# Patient Record
Sex: Male | Born: 1980 | Race: White | Hispanic: No | Marital: Married | Smoking: Current some day smoker
Health system: Southern US, Community
[De-identification: ages and names within clinical notes are randomized; demographics above are authoritative.]

## PROBLEM LIST (undated history)

## (undated) DIAGNOSIS — R569 Unspecified convulsions: Secondary | ICD-10-CM

---

## 2006-12-05 ENCOUNTER — Ambulatory Visit: Payer: Self-pay | Admitting: Otolaryngology

## 2006-12-14 ENCOUNTER — Ambulatory Visit: Payer: Self-pay | Admitting: Otolaryngology

## 2007-04-12 ENCOUNTER — Emergency Department: Payer: Self-pay | Admitting: Emergency Medicine

## 2014-06-13 ENCOUNTER — Emergency Department: Payer: Self-pay | Admitting: Emergency Medicine

## 2014-06-23 ENCOUNTER — Emergency Department: Payer: Self-pay | Admitting: Emergency Medicine

## 2014-06-23 LAB — BASIC METABOLIC PANEL
ANION GAP: 4 — AB (ref 7–16)
BUN: 6 mg/dL — ABNORMAL LOW (ref 7–18)
CHLORIDE: 108 mmol/L — AB (ref 98–107)
CREATININE: 0.94 mg/dL (ref 0.60–1.30)
Calcium, Total: 8.6 mg/dL (ref 8.5–10.1)
Co2: 29 mmol/L (ref 21–32)
EGFR (African American): >60
GLUCOSE: 102 mg/dL — AB (ref 65–99)
Osmolality: 279 (ref 275–301)
Potassium: 4.1 mmol/L (ref 3.5–5.1)
Sodium: 141 mmol/L (ref 136–145)

## 2014-12-30 ENCOUNTER — Emergency Department
Admit: 2014-12-30 | Disposition: A | Discharge status: Home / Self Care | Payer: Self-pay | Admitting: Emergency Medicine

## 2014-12-30 LAB — BASIC METABOLIC PANEL
Anion Gap: 8 (ref 7–16)
BUN: 12 mg/dL
CHLORIDE: 102 mmol/L
CO2: 23 mmol/L
Calcium, Total: 8.9 mg/dL
Creatinine: 0.97 mg/dL
EGFR (African American): >60
EGFR (Non-African Amer.): >60
GLUCOSE: 129 mg/dL — AB
Potassium: 3.4 mmol/L — ABNORMAL LOW
SODIUM: 133 mmol/L — AB

## 2014-12-30 LAB — CBC WITH DIFFERENTIAL/PLATELET
BASOS PCT: 0.2 %
Basophil #: 0 10*3/uL (ref 0.0–0.1)
Eosinophil #: 0 10*3/uL (ref 0.0–0.7)
Eosinophil %: 0 %
HCT: 40.3 % (ref 40.0–52.0)
HGB: 13.8 g/dL (ref 13.0–18.0)
LYMPHS PCT: 6.6 %
Lymphocyte #: 1 10*3/uL (ref 1.0–3.6)
MCH: 29.7 pg (ref 26.0–34.0)
MCHC: 34.3 g/dL (ref 32.0–36.0)
MCV: 87 fL (ref 80–100)
Monocyte #: 1.4 x10 3/mm — ABNORMAL HIGH (ref 0.2–1.0)
Monocyte %: 9 %
NEUTROS PCT: 84.2 %
Neutrophil #: 12.9 10*3/uL — ABNORMAL HIGH (ref 1.4–6.5)
PLATELETS: 193 10*3/uL (ref 150–440)
RBC: 4.65 10*6/uL (ref 4.40–5.90)
RDW: 13.2 % (ref 11.5–14.5)
WBC: 15.3 10*3/uL — ABNORMAL HIGH (ref 3.8–10.6)

## 2014-12-30 LAB — ED INFLUENZA
INFLBPCR: NEGATIVE
Influenza A By PCR: NEGATIVE

## 2019-04-25 ENCOUNTER — Emergency Department
Admission: EM | Admit: 2019-04-25 | Discharge: 2019-04-25 | Disposition: A | Discharge status: Home / Self Care | Payer: Self-pay | Attending: Emergency Medicine | Admitting: Emergency Medicine

## 2019-04-25 ENCOUNTER — Emergency Department: Payer: Self-pay

## 2019-04-25 ENCOUNTER — Other Ambulatory Visit: Payer: Self-pay

## 2019-04-25 DIAGNOSIS — Z79899 Other long term (current) drug therapy: Secondary | ICD-10-CM | POA: Insufficient documentation

## 2019-04-25 DIAGNOSIS — F17220 Nicotine dependence, chewing tobacco, uncomplicated: Secondary | ICD-10-CM | POA: Insufficient documentation

## 2019-04-25 DIAGNOSIS — N23 Unspecified renal colic: Secondary | ICD-10-CM | POA: Insufficient documentation

## 2019-04-25 DIAGNOSIS — F17200 Nicotine dependence, unspecified, uncomplicated: Secondary | ICD-10-CM | POA: Insufficient documentation

## 2019-04-25 DIAGNOSIS — N50811 Right testicular pain: Secondary | ICD-10-CM | POA: Insufficient documentation

## 2019-04-25 HISTORY — DX: Unspecified convulsions: R56.9

## 2019-04-25 LAB — CBC WITH DIFFERENTIAL/PLATELET
Abs Immature Granulocytes: 0.03 10*3/uL (ref 0.00–0.07)
Basophils Absolute: 0.1 10*3/uL (ref 0.0–0.1)
Basophils Relative: 1 %
Eosinophils Absolute: 0.5 10*3/uL (ref 0.0–0.5)
Eosinophils Relative: 5 %
HCT: 42.3 % (ref 39.0–52.0)
Hemoglobin: 14.7 g/dL (ref 13.0–17.0)
Immature Granulocytes: 0 %
Lymphocytes Relative: 29 %
Lymphs Abs: 2.8 10*3/uL (ref 0.7–4.0)
MCH: 29.7 pg (ref 26.0–34.0)
MCHC: 34.8 g/dL (ref 30.0–36.0)
MCV: 85.5 fL (ref 80.0–100.0)
Monocytes Absolute: 0.5 10*3/uL (ref 0.1–1.0)
Monocytes Relative: 5 %
Neutro Abs: 5.7 10*3/uL (ref 1.7–7.7)
Neutrophils Relative %: 60 %
Platelets: 323 10*3/uL (ref 150–400)
RBC: 4.95 MIL/uL (ref 4.22–5.81)
RDW: 13.1 % (ref 11.5–15.5)
WBC: 9.7 10*3/uL (ref 4.0–10.5)
nRBC: 0 % (ref 0.0–0.2)

## 2019-04-25 LAB — URINALYSIS, COMPLETE (UACMP) WITH MICROSCOPIC
Bilirubin Urine: NEGATIVE
Glucose, UA: NEGATIVE mg/dL
Ketones, ur: 20 mg/dL — AB
Leukocytes,Ua: NEGATIVE
Nitrite: NEGATIVE
Protein, ur: 30 mg/dL — AB
RBC / HPF: >50 RBC/hpf — ABNORMAL HIGH (ref 0–5)
Specific Gravity, Urine: 1.019 (ref 1.005–1.030)
pH: 6 (ref 5.0–8.0)

## 2019-04-25 LAB — COMPREHENSIVE METABOLIC PANEL
ALT: 24 U/L (ref 0–44)
AST: 23 U/L (ref 15–41)
Albumin: 4.3 g/dL (ref 3.5–5.0)
Alkaline Phosphatase: 77 U/L (ref 38–126)
Anion gap: 12 (ref 5–15)
BUN: 10 mg/dL (ref 6–20)
CO2: 17 mmol/L — ABNORMAL LOW (ref 22–32)
Calcium: 9.3 mg/dL (ref 8.9–10.3)
Chloride: 105 mmol/L (ref 98–111)
Creatinine, Ser: 1.08 mg/dL (ref 0.61–1.24)
GFR calc Af Amer: >60 mL/min (ref >60)
GFR calc non Af Amer: >60 mL/min (ref >60)
Glucose, Bld: 158 mg/dL — ABNORMAL HIGH (ref 70–99)
Potassium: 3.7 mmol/L (ref 3.5–5.1)
Sodium: 134 mmol/L — ABNORMAL LOW (ref 135–145)
Total Bilirubin: 0.6 mg/dL (ref 0.3–1.2)
Total Protein: 7.7 g/dL (ref 6.5–8.1)

## 2019-04-25 MED ORDER — HYDROMORPHONE HCL 1 MG/ML IJ SOLN
1.0000 mg | Freq: Once | INTRAMUSCULAR | Status: AC
  Administered 2019-04-25: 1 mg via INTRAVENOUS
  Filled 2019-04-25: qty 1

## 2019-04-25 MED ORDER — KETOROLAC TROMETHAMINE 30 MG/ML IJ SOLN
30.0000 mg | Freq: Once | INTRAMUSCULAR | Status: AC
  Administered 2019-04-25: 30 mg via INTRAVENOUS
  Filled 2019-04-25: qty 1

## 2019-04-25 MED ORDER — HYDROMORPHONE HCL 1 MG/ML IJ SOLN
INTRAMUSCULAR | Status: AC
  Administered 2019-04-25: 1 mg via INTRAVENOUS
  Filled 2019-04-25: qty 1

## 2019-04-25 MED ORDER — HYDROMORPHONE HCL 1 MG/ML IJ SOLN
1.0000 mg | Freq: Once | INTRAMUSCULAR | Status: AC
  Administered 2019-04-25 (×2): 1 mg via INTRAVENOUS

## 2019-04-25 MED ORDER — OXYCODONE-ACETAMINOPHEN 5-325 MG PO TABS: 1.0000 | ORAL_TABLET | ORAL | 0 refills | Status: AC | PRN

## 2019-04-25 MED ORDER — HYDROMORPHONE HCL 1 MG/ML IJ SOLN
1.0000 mg | Freq: Once | INTRAMUSCULAR | Status: AC
  Administered 2019-04-25: 1 mg via INTRAVENOUS

## 2019-04-25 MED ORDER — ONDANSETRON HCL 4 MG/2ML IJ SOLN
4.0000 mg | Freq: Once | INTRAMUSCULAR | Status: AC
  Administered 2019-04-25: 4 mg via INTRAVENOUS
  Filled 2019-04-25: qty 2

## 2019-04-25 NOTE — ED Notes (Signed)
Reports pain mostly in his back on right side, radiates into his groin area, reports pain meds given in route to ed barely took the pain aware, denies nausea at present time.

## 2019-04-25 NOTE — ED Notes (Signed)
Patient feeling better, reports pain level down significantly, sitting up in bed smiling and laughing with wife. Urine specimen requested. Will attempt a sample. Will continue to monitor.

## 2019-04-25 NOTE — ED Triage Notes (Signed)
Patient from home "Reports sudden onset of right flank pain that began @ 0900 this morning with pain radiating into right testicle" . Also c/o of nausea and vomiting that came with the onset of pain. Patient given total 150mcg IV fentanyl and 4 zofran in route prior to ed arrival.

## 2019-04-25 NOTE — ED Notes (Signed)
Awaiting disposition.

## 2019-04-25 NOTE — ED Notes (Signed)
Second dose of 1 mg diluadid given as per Dr. Rip Harbour. Ct called to come get patient for study. Wife at bedside.

## 2019-04-25 NOTE — ED Notes (Signed)
Patient unable to go to CT due to pain. Md made aware.

## 2019-04-25 NOTE — ED Notes (Signed)
urione and urine culture obtained and sent

## 2019-04-25 NOTE — Discharge Instructions (Addendum)
Please drink plenty of fluids.  Please follow-up with urology. Dr. Diamantina Providence is on call.  Give the office a call today or tomorrow let them know you had a kidney stone.  You should get a follow-up with him in the next week or so.  Please return here for worse pain fever or vomiting.  If you have further pain take the Percocet 1 pill 4 times a day as needed.  If you do not have any further pain do not take the Percocet.

## 2019-04-25 NOTE — ED Provider Notes (Addendum)
[facility] Emergency Department Provider Note   ____________________________________________   First MD Initiated Contact with Patient 04/25/19 1213     (approximate)  I have reviewed the triage vital signs and the nursing notes.   HISTORY  Chief Complaint Testicle Pain and Flank Pain    HPI Cole Wood is a 38 y.o. male reports testicular pain starting this morning.  Pain started in the right flank and went around toward the testicle and into the testicle with the testicles also tender to touch.  Its scrotum was contracted up against the abdomen.  There is also a right lower quadrant tenderness.  Pain is severe achy patient is not had a need to urinate.  Nothing makes it better or worse except for palpation which makes it worse.  He is not running a fever.  He is not having any vomiting or diarrhea.        Past Medical History:  Diagnosis Date   Seizures (HCC)    reports no longer have them. sz due to migraines.     There are no active problems to display for this patient.   History reviewed. No pertinent surgical history.  Prior to Admission medications   Medication Sig Start Date End Date Taking? Authorizing Provider  esomeprazole (NEXIUM) 20 MG capsule Take 20 mg by mouth daily at 12 noon.   Yes [provider]  venlafaxine (EFFEXOR) 75 MG tablet Take 75 mg by mouth 2 (two) times daily. 11/16/18  Yes [provider]  oxyCODONE-acetaminophen (PERCOCET) 5-325 MG tablet Take 1 tablet by mouth every 4 (four) hours as needed for severe pain. 04/25/19 04/24/20  Arnaldo NatalMalinda, Dorrene Bently F, MD    Allergies Patient has no known allergies.  History reviewed. No pertinent family history.  Social History Social History   Tobacco Use   Smoking status: Current Some Day Smoker   Smokeless tobacco: Current User  Substance Use Topics   Alcohol use: Not on file   Drug use: Not on file    Review of Systems  Constitutional: No  fever/chills Eyes: No visual changes. ENT: No sore throat. Cardiovascular: Denies chest pain. Respiratory: Denies shortness of breath. Gastrointestinal: See HPI Genitourinary: Negative for dysuria. Musculoskeletal: Right CVA area back pain. Skin: Negative for rash. Neurological: Negative for headaches, focal weakness  ____________________________________________   PHYSICAL EXAM:  VITAL SIGNS: ED Triage Vitals  Enc Vitals Group     BP 04/25/19 1207 (!) 157/138     Pulse Rate 04/25/19 1207 63     Resp 04/25/19 1207 (!) 36     Temp 04/25/19 1207 97.6 F (36.4 C)     Temp Source 04/25/19 1207 Oral     SpO2 04/25/19 1203 97 %     Weight 04/25/19 1209 240 lb (108.9 kg)     Height 04/25/19 1209 6\' 2"  (1.88 m)     Head Circumference --      Peak Flow --      Pain Score 04/25/19 1208 10     Pain Loc --      Pain Edu? --      Excl. in GC? --     Constitutional: Alert and oriented.  In severe pain Eyes: Conjunctivae are normal.  Head: Atraumatic. Nose: No congestion/rhinnorhea. Mouth/Throat: Mucous membranes are moist.  Oropharynx non-erythematous. Neck: No stridor. Cardiovascular: Normal rate, regular rhythm. Grossly normal heart sounds.  Good peripheral circulation. Respiratory: Normal respiratory effort.  No retractions. Lungs CTAB. Gastrointestinal: Soft right lower quadrant tenderness.  No distention. No abdominal bruits.  Right CVA tenderness. Musculoskeletal: No lower extremity tenderness nor edema.  Neurologic:  Normal speech and language. No gross focal neurologic deficits are appreciated. No gait instability. Skin:  Skin is warm, dry and intact. No rash noted. GU: Right testicle is not enlarged does not appear to have a unusual lie but it is contracted epigastric trunk.  Is also tender. ____________________________________________   LABS (all labs ordered are listed, but only abnormal results are displayed)  Labs Reviewed  URINALYSIS, COMPLETE (UACMP) WITH  MICROSCOPIC - Abnormal; Notable for the following components:      Result Value   Color, Urine YELLOW (*)    APPearance HAZY (*)    Hgb urine dipstick LARGE (*)    Ketones, ur 20 (*)    Protein, ur 30 (*)    RBC / HPF >50 (*)    Bacteria, UA RARE (*)    All other components within normal limits  COMPREHENSIVE METABOLIC PANEL - Abnormal; Notable for the following components:   Sodium 134 (*)    CO2 17 (*)    Glucose, Bld 158 (*)    All other components within normal limits  CBC WITH DIFFERENTIAL/PLATELET  URINALYSIS, COMPLETE (UACMP) WITH MICROSCOPIC   ____________________________________________  EKG   ____________________________________________  RADIOLOGY  ED MD interpretation:   Official radiology report(s): Ct Renal Stone Study  Result Date: 04/25/2019 CLINICAL DATA:  Right flank pain with nausea and vomiting EXAM: CT ABDOMEN AND PELVIS WITHOUT CONTRAST TECHNIQUE: Multidetector CT imaging of the abdomen and pelvis was performed following the standard protocol without oral or IV contrast. COMPARISON:  None. FINDINGS: Lower chest: Lung bases are clear. Hepatobiliary: There is hepatic steatosis. No liver lesions are apparent on this noncontrast enhanced study. Gallbladder wall is not appreciably thickened. There is no biliary duct dilatation. Pancreas: There is no pancreatic mass or inflammatory focus. Spleen: No splenic lesions are evident. Adrenals/Urinary Tract: Adrenals bilaterally appear normal. Kidneys bilaterally show no evident mass. There is slight hydronephrosis on the right. There is no hydronephrosis on the left. There is no intrarenal calculus on either side. There is a 2 mm calculus at the right ureterovesical junction. There is mild edema tracking along the right ureter throughout most of its course. No other ureteral calculi are evident on either side. Urinary bladder is midline with wall thickness within normal limits. Stomach/Bowel: There is no appreciable bowel  wall or mesenteric thickening. No evident bowel obstruction. Terminal ileum appears normal. No free air or portal venous air. Vascular/Lymphatic: There is no abdominal aortic aneurysm. No vascular lesions are evident on this noncontrast enhanced study. There is no adenopathy in the abdomen or pelvis. Reproductive: There are a few small prostatic calculi. Prostate and seminal vesicles are normal in size and contour. No evident pelvic mass. Other: The appendix appears normal. There is no abscess or ascites in the abdomen or pelvis. There is a rather minimal ventral hernia containing only fat. Musculoskeletal: There are no blastic or lytic bone lesions. There is no intramuscular or abdominal wall lesion. IMPRESSION: 1. 2 mm calculus at the right ureterovesical junction with mild hydronephrosis on the right. 2. No bowel obstruction. No abscess in the abdomen or pelvis. Appendix appears normal. 3.  Hepatic steatosis. 4.  Rather minimal ventral hernia containing only fat. Electronically Signed   By: Bretta BangWilliam  Woodruff III M.D.   On: 04/25/2019 13:32   [place] Scrotum W/doppler  Result Date: 04/25/2019 CLINICAL DATA:  Right-sided testicle pain EXAM: SCROTAL ULTRASOUND  DOPPLER ULTRASOUND OF THE TESTICLES TECHNIQUE: Complete ultrasound examination of the testicles, epididymis, and other scrotal structures was performed. Color and spectral Doppler ultrasound were also utilized to evaluate blood flow to the testicles. COMPARISON:  None. FINDINGS: Right testicle Measurements: 5 x 2.6 x 3.1 cm. No mass or microlithiasis visualized. Left testicle Measurements: 5.1 x 2.4 x 3.1 cm. No mass or microlithiasis visualized. Right epididymis:  Normal in size and appearance. Left epididymis:  Normal in size and appearance. Hydrocele:  None visualized. Varicocele:  None visualized. Pulsed Doppler interrogation of both testes demonstrates normal low resistance arterial and venous waveforms bilaterally. IMPRESSION: Negative scrotal ultrasound  Electronically Signed   By: Donavan Foil M.D.   On: 04/25/2019 14:00    ____________________________________________   PROCEDURES  Procedure(s) performed (including Critical Care):  Procedures   ____________________________________________   INITIAL IMPRESSION / ASSESSMENT AND PLAN / ED COURSE  This may be a renal stone in fact probably is a renal stone but because of the testicle pain and the fact the scrotum is contracted up against the abdomen on the right side only we will get an ultrasound of the testicle before we do anything else.       CT shows a stone almost very to go into the bladder ultrasound is normal we will discharge the patient.  On discharge she is much better not having any further pain.  Patient will strain all his urine.  Nurse will get him a strainer and explained how to do it.  He will take the stone to the urologist if he can catch it.       ____________________________________________   FINAL CLINICAL IMPRESSION(S) / ED DIAGNOSES  Final diagnoses:  Ureteral colic     ED Discharge Orders         Ordered    oxyCODONE-acetaminophen (PERCOCET) 5-325 MG tablet  Every 4 hours PRN     04/25/19 1653           Note:  This document was prepared using Dragon voice recognition software and may include unintentional dictation errors.    Nena Polio, MD 04/25/19 1659    Nena Polio, MD 04/25/19 [phone]

## 2020-06-14 IMAGING — US ULTRASOUND SCROTUM DOPPLER COMPLETE
1 series · 14 of 25 positions shown · non-contrast
Comparison: None.

CLINICAL DATA: Right-sided testicle pain

EXAM:
SCROTAL ULTRASOUND
DOPPLER ULTRASOUND OF THE TESTICLES
TECHNIQUE: Complete ultrasound examination of the testicles, epididymis, and
other scrotal structures was performed. Color and spectral Doppler
ultrasound were also utilized to evaluate blood flow to the
testicles.

[Series 1: ultrasound scrotum doppler complete · 14 of 51 slices shown]
[im 1/51]
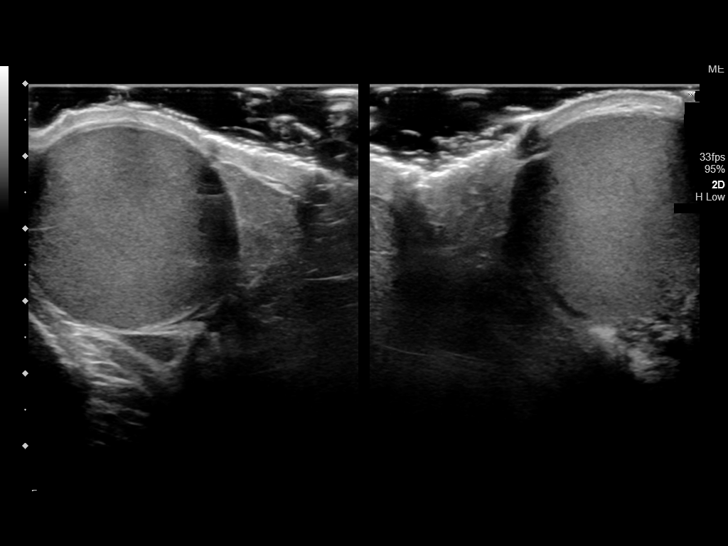
[im 5/51]
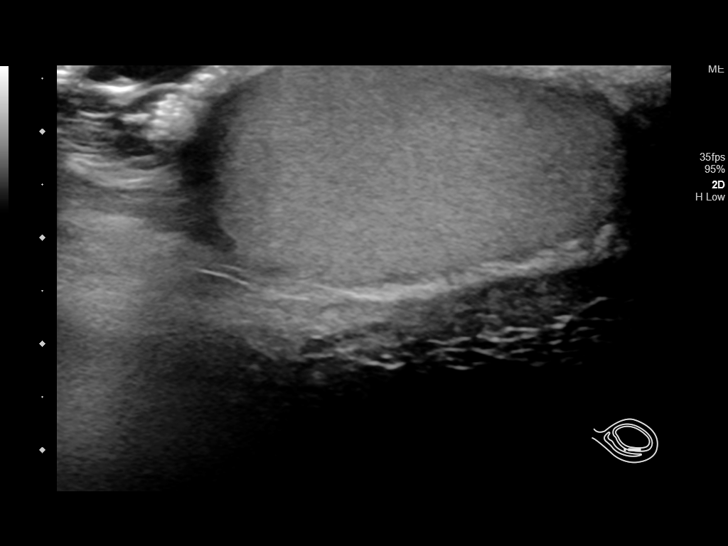
[im 9/51]
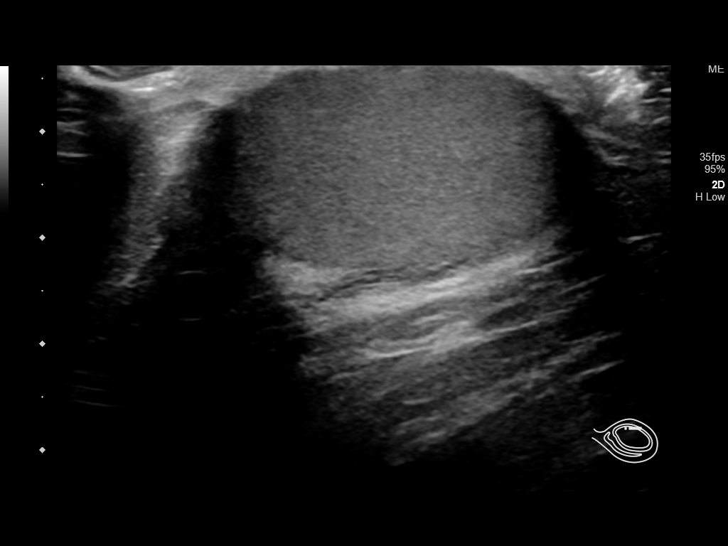
[im 13/51]
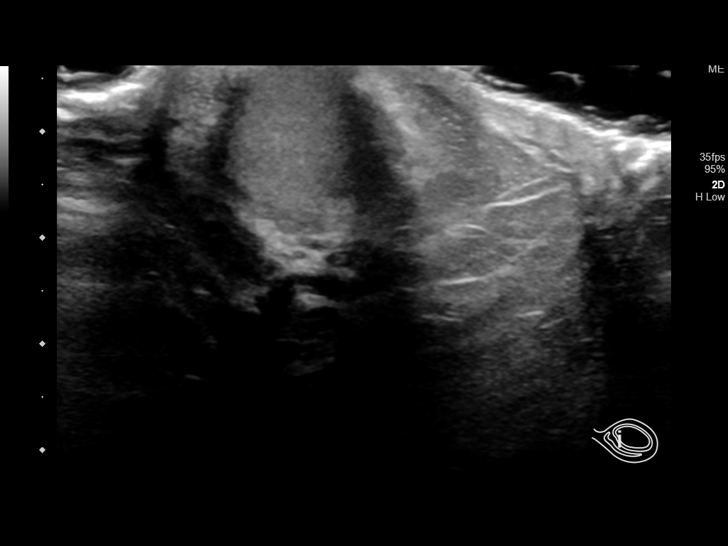
[im 17/51]
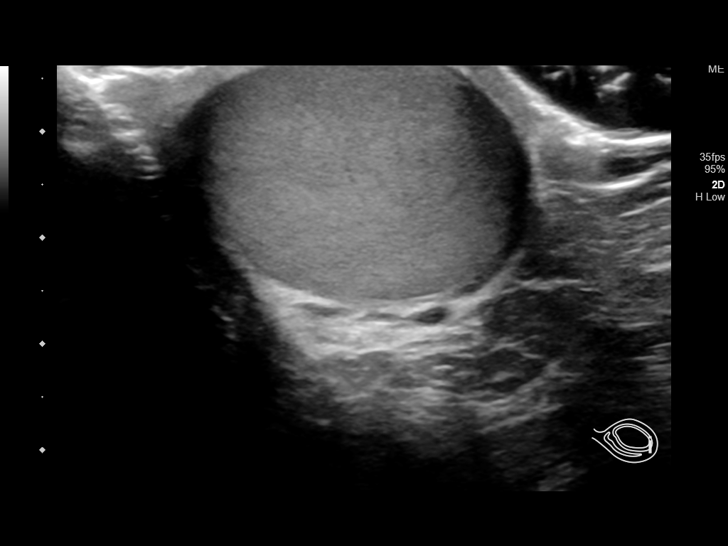
[im 19/51]
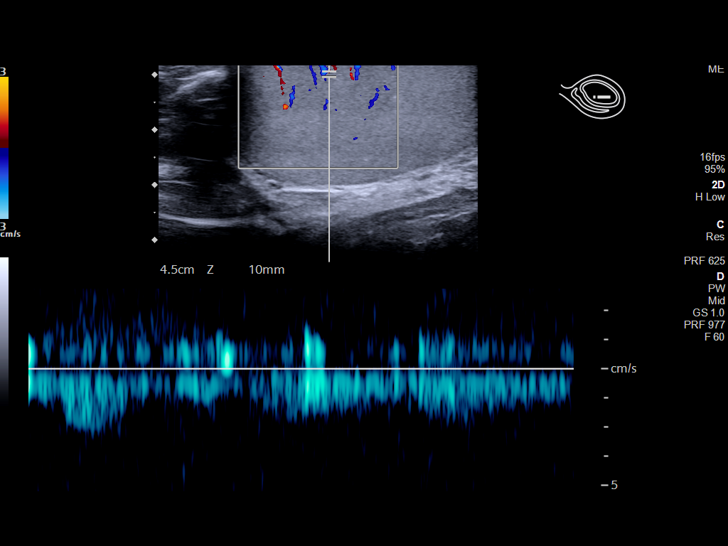
[im 23/51]
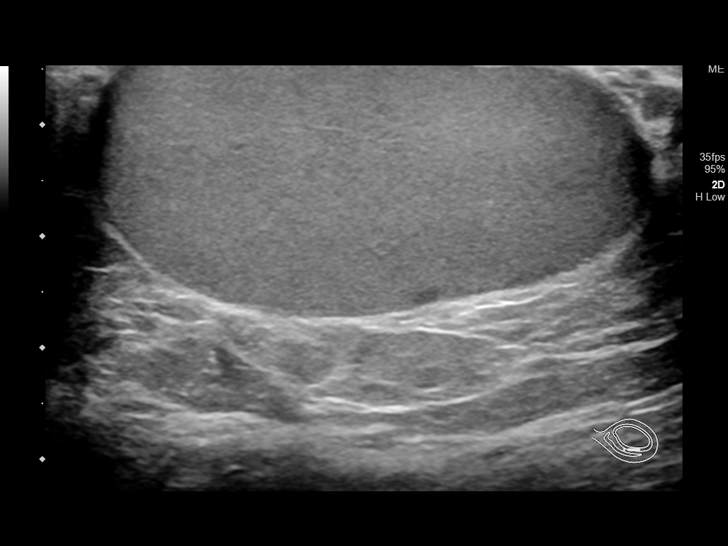
[im 28/51]
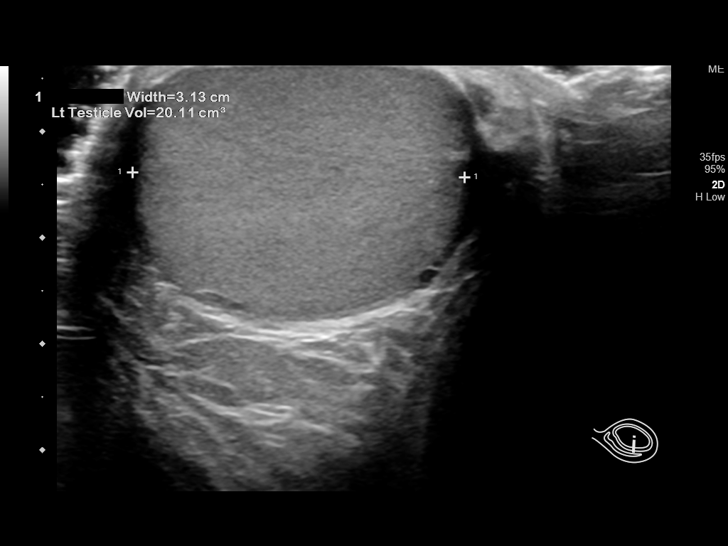
[im 32/51]
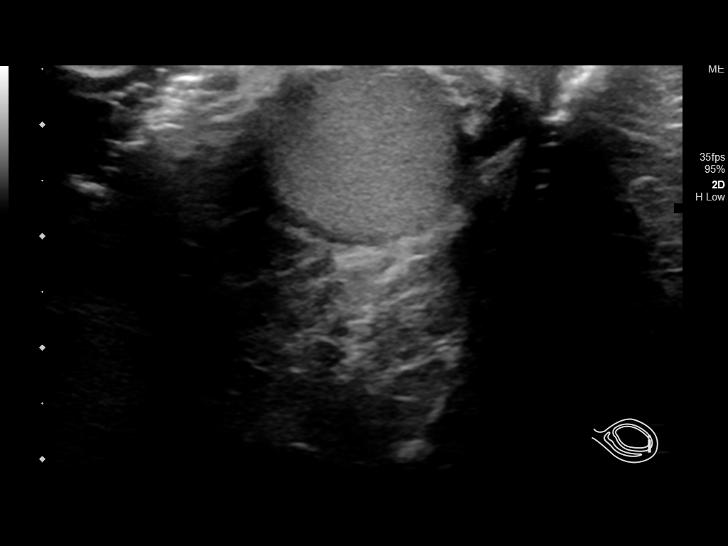
[im 34/51]
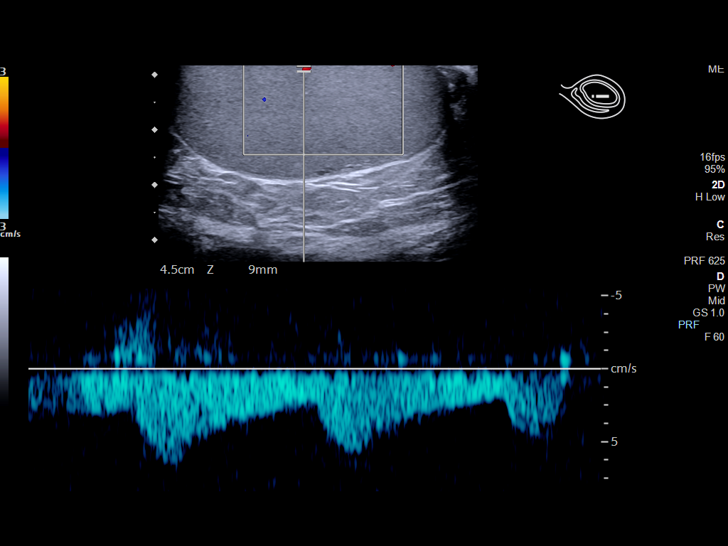
[im 38/51]
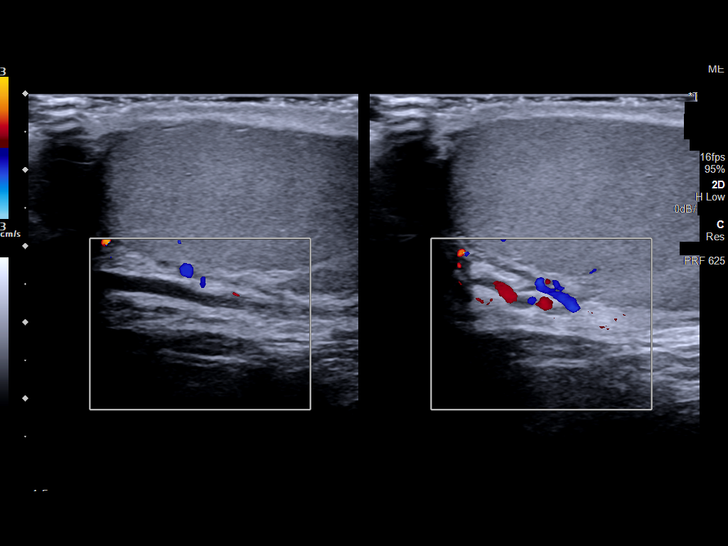
[im 42/51]
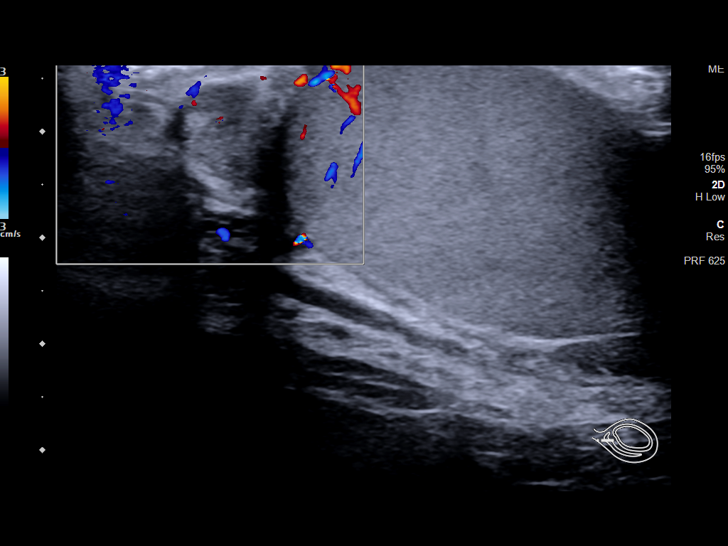
[im 46/51]
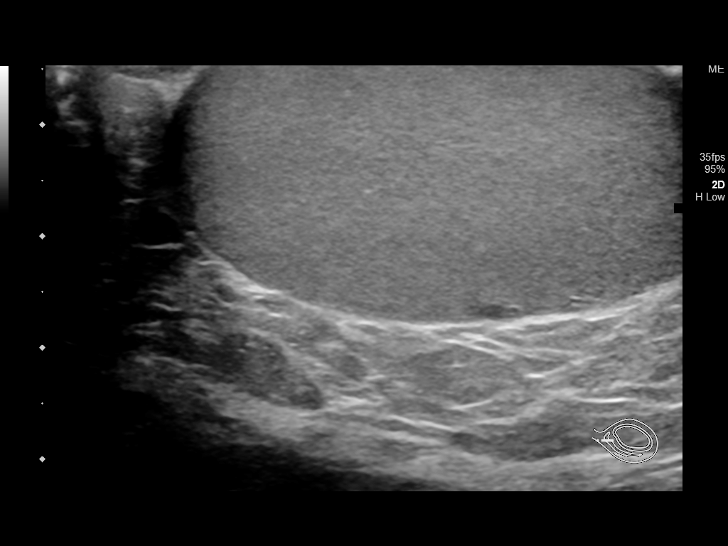
[im 51/51]
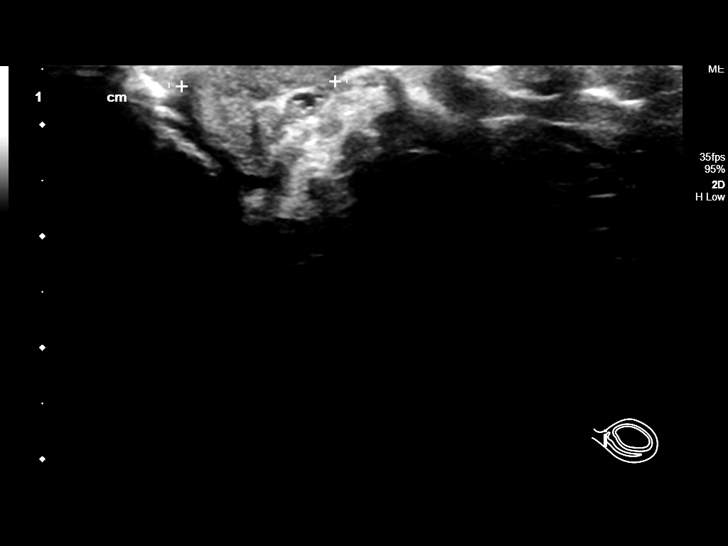

[14 of 25 positions shown; findings below may reference images not displayed]

FINDINGS: Right testicle

Measurements: 5 x 2.6 x 3.1 cm. No mass or microlithiasis
visualized.

Left testicle

Measurements: 5.1 x 2.4 x 3.1 cm. No mass or microlithiasis
visualized.

Right epididymis:  Normal in size and appearance.

Left epididymis:  Normal in size and appearance.

Hydrocele:  None visualized.

Varicocele:  None visualized.

Pulsed Doppler interrogation of both testes demonstrates normal low
resistance arterial and venous waveforms bilaterally.
IMPRESSION: Negative scrotal ultrasound

## 2020-06-14 IMAGING — CT CT RENAL STONE PROTOCOL
2 of 4 series · 16 of 46 positions shown, 18 images · non-contrast
Comparison: None.

CLINICAL DATA: Right flank pain with nausea and vomiting

EXAM:
CT ABDOMEN AND PELVIS WITHOUT CONTRAST
TECHNIQUE: Multidetector CT imaging of the abdomen and pelvis was performed
following the standard protocol without oral or IV contrast.

[Series 2: stone full standard · axial · 0.77mm/px · z∈[-1099,-604]mm · 13 of 109 slices shown, 15 images]
[im 5/109  soft-tissue]
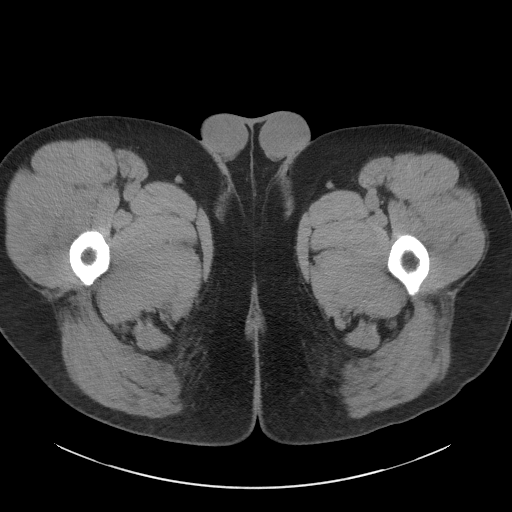
[im 5/109  bone]
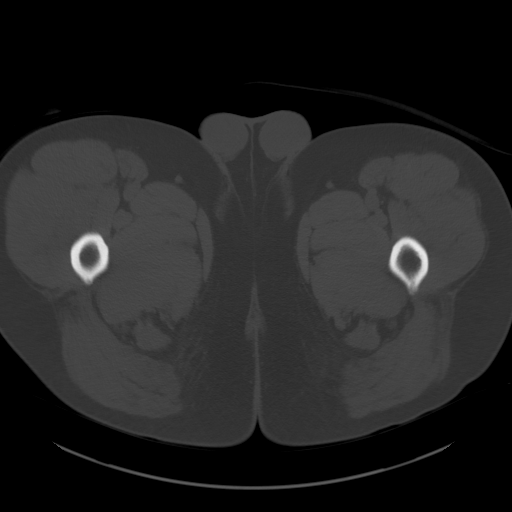
[im 13/109  soft-tissue]
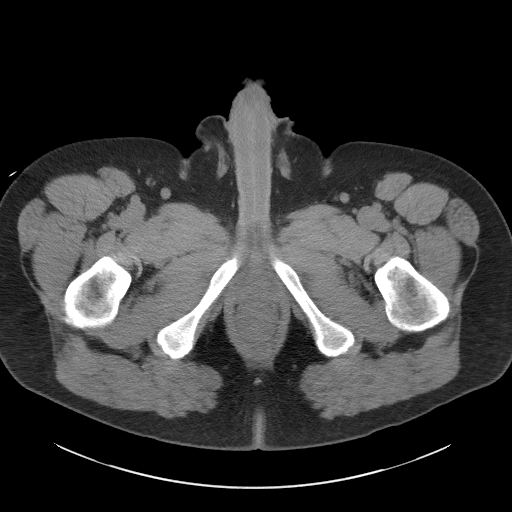
[im 22/109  soft-tissue]
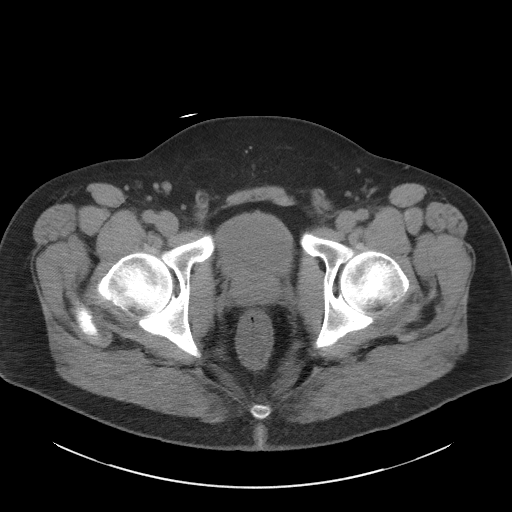
[im 31/109  soft-tissue]
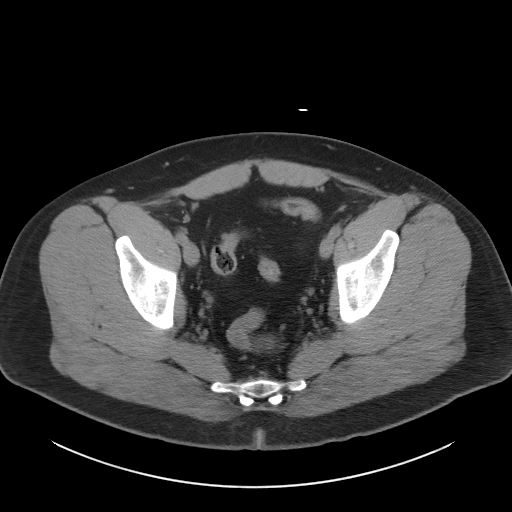
[im 39/109  soft-tissue]
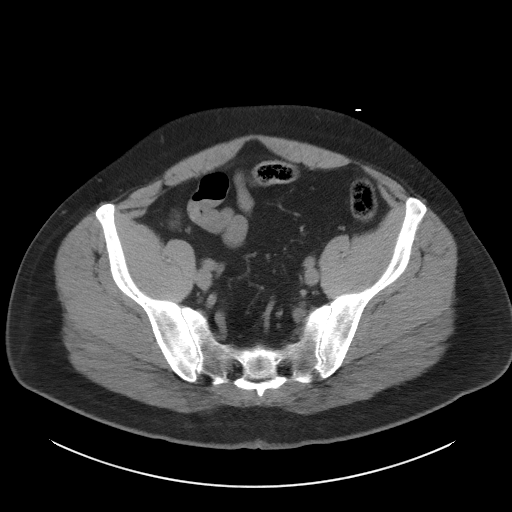
[im 48/109  soft-tissue]
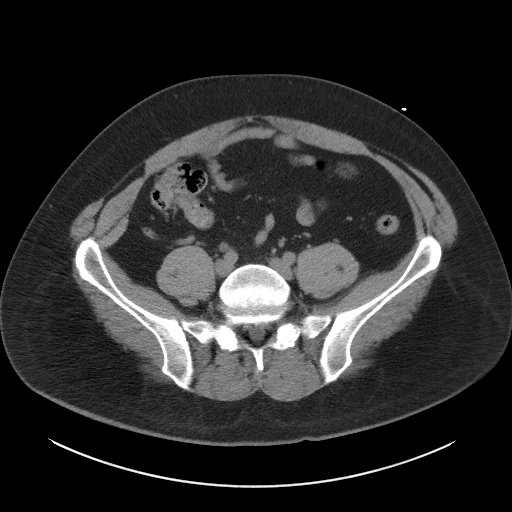
[im 57/109  soft-tissue]
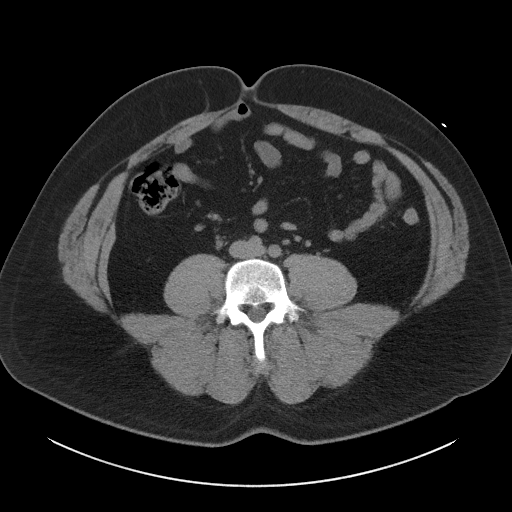
[im 61/109  soft-tissue]
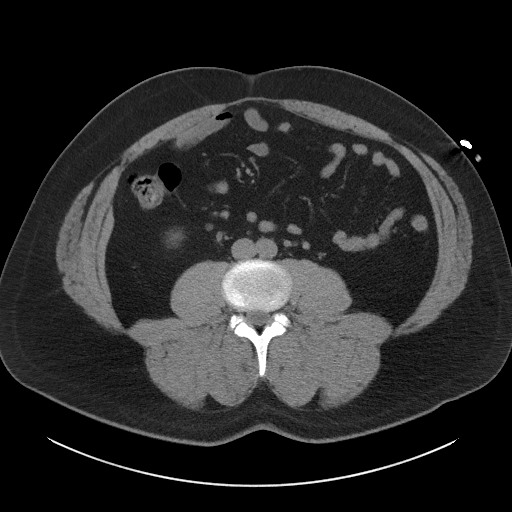
[im 70/109  soft-tissue]
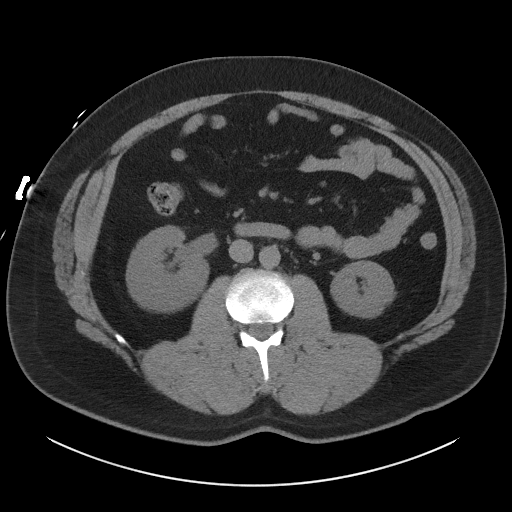
[im 70/109  bone]
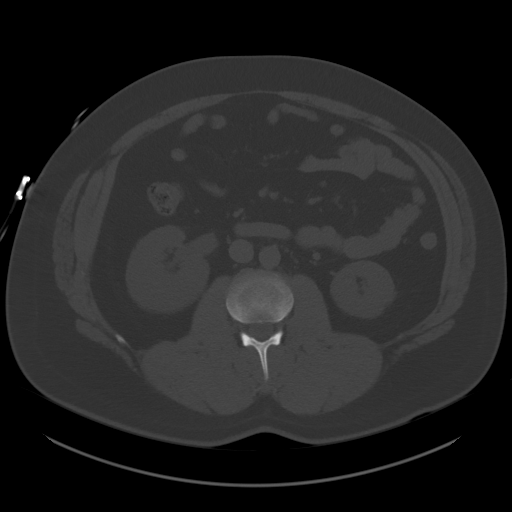
[im 78/109  soft-tissue]
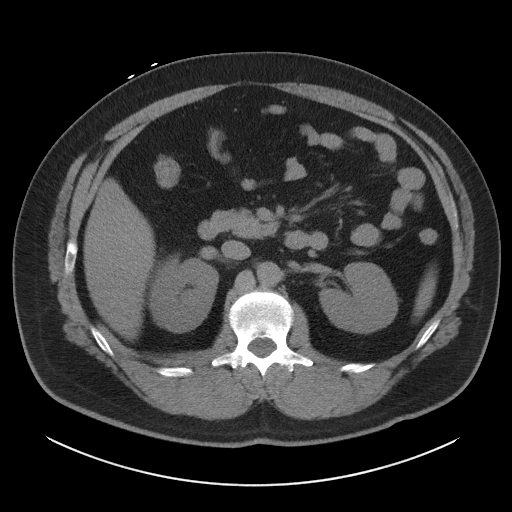
[im 87/109  soft-tissue]
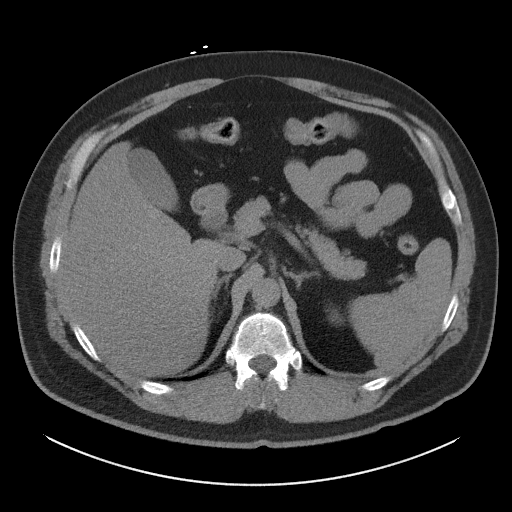
[im 96/109  soft-tissue]
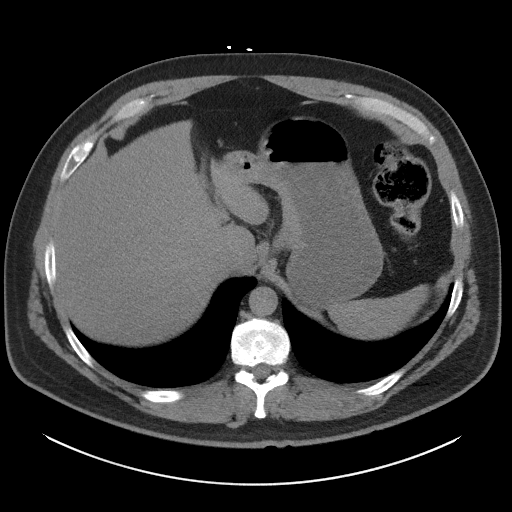
[im 104/109  soft-tissue]
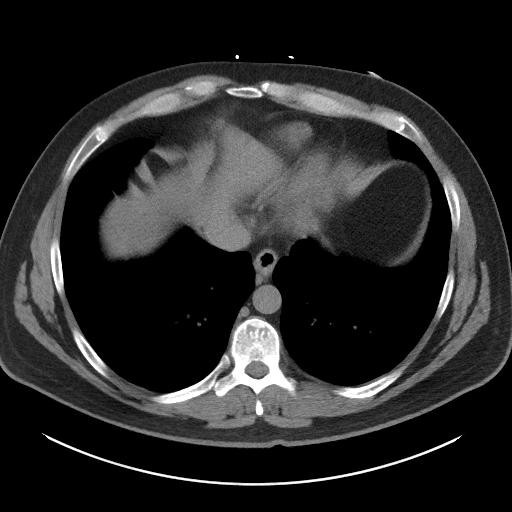

[Series 5: coronal · coronal · 0.84mm/px · 3 of 157 slices shown]
[im 53/157  soft-tissue]
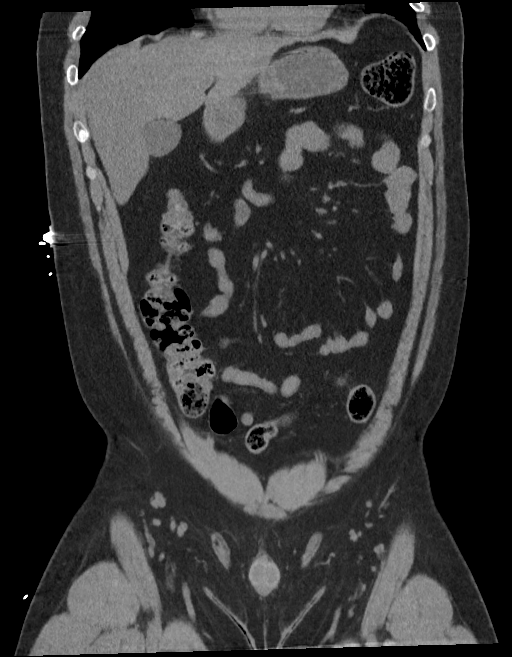
[im 70/157  soft-tissue]
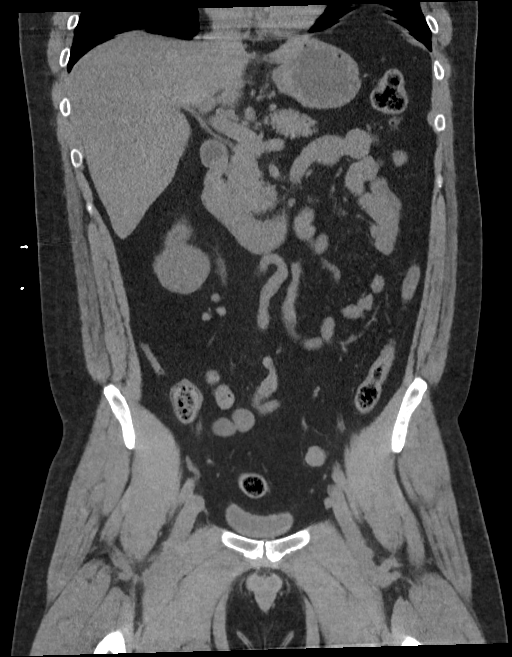
[im 87/157  soft-tissue]
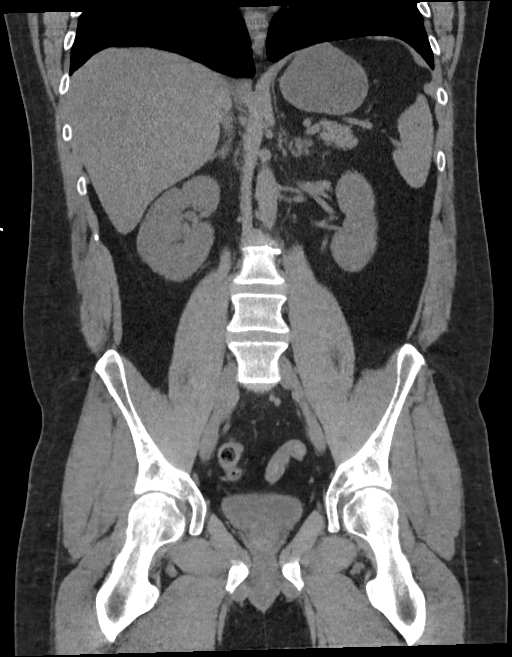

[16 of 46 positions shown; findings below may reference images not displayed]

FINDINGS: Lower chest: Lung bases are clear.

Hepatobiliary: There is hepatic steatosis. No liver lesions are
apparent on this noncontrast enhanced study. Gallbladder wall is not
appreciably thickened. There is no biliary duct dilatation.

Pancreas: There is no pancreatic mass or inflammatory focus.

Spleen: No splenic lesions are evident.

Adrenals/Urinary Tract: Adrenals bilaterally appear normal. Kidneys
bilaterally show no evident mass. There is slight hydronephrosis on
the right. There is no hydronephrosis on the left. There is no
intrarenal calculus on either side. There is a 2 mm calculus at the
right ureterovesical junction. There is mild edema tracking along
the right ureter throughout most of its course. No other ureteral
calculi are evident on either side. Urinary bladder is midline with
wall thickness within normal limits.

Stomach/Bowel: There is no appreciable bowel wall or mesenteric
thickening. No evident bowel obstruction. Terminal ileum appears
normal. No free air or portal venous air.

Vascular/Lymphatic: There is no abdominal aortic aneurysm. No
vascular lesions are evident on this noncontrast enhanced study.
There is no adenopathy in the abdomen or pelvis.

Reproductive: There are a few small prostatic calculi. Prostate and
seminal vesicles are normal in size and contour. No evident pelvic
mass.

Other: The appendix appears normal. There is no abscess or ascites
in the abdomen or pelvis. There is a rather minimal ventral hernia
containing only fat.

Musculoskeletal: There are no blastic or lytic bone lesions. There
is no intramuscular or abdominal wall lesion.
IMPRESSION: 1. 2 mm calculus at the right ureterovesical junction with mild
hydronephrosis on the right.

2. No bowel obstruction. No abscess in the abdomen or pelvis.
Appendix appears normal.

3.  Hepatic steatosis.

4.  Rather minimal ventral hernia containing only fat.

## 2024-01-30 ENCOUNTER — Other Ambulatory Visit: Payer: Self-pay

## 2024-01-30 ENCOUNTER — Emergency Department

## 2024-01-30 ENCOUNTER — Emergency Department
Admission: EM | Admit: 2024-01-30 | Discharge: 2024-01-30 | Disposition: A | Discharge status: Home / Self Care | Attending: Emergency Medicine | Admitting: Emergency Medicine

## 2024-01-30 ENCOUNTER — Encounter: Payer: Self-pay | Admitting: Emergency Medicine

## 2024-01-30 DIAGNOSIS — R519 Headache, unspecified: Secondary | ICD-10-CM | POA: Diagnosis present

## 2024-01-30 DIAGNOSIS — R001 Bradycardia, unspecified: Secondary | ICD-10-CM | POA: Insufficient documentation

## 2024-01-30 LAB — BASIC METABOLIC PANEL WITH GFR
Anion gap: 9 (ref 5–15)
BUN: 10 mg/dL (ref 6–20)
CO2: 23 mmol/L (ref 22–32)
Calcium: 9 mg/dL (ref 8.9–10.3)
Chloride: 107 mmol/L (ref 98–111)
Creatinine, Ser: 0.91 mg/dL (ref 0.61–1.24)
GFR, Estimated: >60 mL/min (ref >60)
Glucose, Bld: 128 mg/dL — ABNORMAL HIGH (ref 70–99)
Potassium: 4.1 mmol/L (ref 3.5–5.1)
Sodium: 139 mmol/L (ref 135–145)

## 2024-01-30 LAB — TROPONIN I (HIGH SENSITIVITY)
Troponin I (High Sensitivity): 5 ng/L (ref <18)
Troponin I (High Sensitivity): 4 ng/L (ref <18)

## 2024-01-30 LAB — CBC
HCT: 45.4 % (ref 39.0–52.0)
Hemoglobin: 15.6 g/dL (ref 13.0–17.0)
MCH: 30.5 pg (ref 26.0–34.0)
MCHC: 34.4 g/dL (ref 30.0–36.0)
MCV: 88.7 fL (ref 80.0–100.0)
Platelets: 304 10*3/uL (ref 150–400)
RBC: 5.12 MIL/uL (ref 4.22–5.81)
RDW: 13.2 % (ref 11.5–15.5)
WBC: 9.4 10*3/uL (ref 4.0–10.5)
nRBC: 0 % (ref 0.0–0.2)

## 2024-01-30 LAB — SEDIMENTATION RATE: Sed Rate: 6 mm/h (ref 0–15)

## 2024-01-30 MED ORDER — SODIUM CHLORIDE 0.9 % IV BOLUS
1000.0000 mL | Freq: Once | INTRAVENOUS | Status: AC
  Administered 2024-01-30: 1000 mL via INTRAVENOUS

## 2024-01-30 MED ORDER — DIPHENHYDRAMINE HCL 50 MG/ML IJ SOLN
25.0000 mg | Freq: Once | INTRAMUSCULAR | Status: AC
  Administered 2024-01-30: 25 mg via INTRAVENOUS
  Filled 2024-01-30: qty 1

## 2024-01-30 MED ORDER — KETOROLAC TROMETHAMINE 30 MG/ML IJ SOLN
30.0000 mg | Freq: Once | INTRAMUSCULAR | Status: AC
  Administered 2024-01-30: 30 mg via INTRAVENOUS
  Filled 2024-01-30: qty 1

## 2024-01-30 MED ORDER — DEXAMETHASONE SODIUM PHOSPHATE 10 MG/ML IJ SOLN
10.0000 mg | Freq: Once | INTRAMUSCULAR | Status: AC
Start: 2024-01-30 — End: 2024-01-30
  Administered 2024-01-30: 10 mg via INTRAVENOUS
  Filled 2024-01-30: qty 1

## 2024-01-30 MED ORDER — PROCHLORPERAZINE EDISYLATE 10 MG/2ML IJ SOLN
10.0000 mg | Freq: Once | INTRAMUSCULAR | Status: AC
  Administered 2024-01-30: 10 mg via INTRAVENOUS
  Filled 2024-01-30: qty 2

## 2024-01-30 NOTE — ED Triage Notes (Signed)
 Pt states that his left arm started hurting 2 nights ago, states that his arm feels like a rubber band from shoulder to elbow and elbow to hand feels weak and numb. Pt states last night he started having pain on the left side of his temple and reports that the pain radiates down his face to his neck

## 2024-01-30 NOTE — ED Provider Notes (Signed)
 [facility] AT [facility] Provider Note   CSN: [id number] Arrival date & time: 01/30/24  1433     History  Chief Complaint  Patient presents with   Arm Pain   Facial Pain   Neck Pain    Cole Wood is a 43 y.o. male with history of migraine headaches who is on Effexor and Fioricet presents to the emergency department for evaluation of left-sided headache, points to the temporal region.  His normal headaches consist of ocular pain.  He states for 2 days he has had 8 out of 10 pain along the left temporal region with some pain in the left shoulder and some numbness in the left hand.  He has taken his normal medications with no improvement.  Recently given some breakthrough medication, Ubrogepant but unfortunately could not afford it as it was greater than thousand dollars.  He denies any trauma or injury.  No recent illness or fevers.  No vision changes.  No weakness in the upper extremity.  No vertigo.  HPI     Home Medications Prior to Admission medications   Medication Sig Start Date End Date Taking? Authorizing Provider  esomeprazole (NEXIUM) 20 MG capsule Take 20 mg by mouth daily at 12 noon.    [provider]  venlafaxine (EFFEXOR) 75 MG tablet Take 75 mg by mouth 2 (two) times daily. 11/16/18   [provider]      Allergies    Patient has no known allergies.    Review of Systems   Review of Systems  Physical Exam Updated Vital Signs BP (!) 170/106   Pulse 83   Temp 98.3 F (36.8 C) (Oral)   Resp 16   Ht 6\' 2"  (1.88 m)   Wt 108.9 kg   SpO2 100%   BMI 30.81 kg/m  Physical Exam Constitutional:      Appearance: Normal appearance. He is well-developed.  HENT:     Head: Normocephalic and atraumatic.     Comments: No swelling or bruising along the left side of the face, head, no temporal tenderness.  Minimal TMJ tenderness, no trismus.    Right Ear: Tympanic membrane, ear canal and external ear normal. There is no  impacted cerumen.     Left Ear: Tympanic membrane, ear canal and external ear normal. There is no impacted cerumen.     Ears:     Comments: Mild cerumen present throughout the canal but TM visualized and normal    Mouth/Throat:     Pharynx: Oropharynx is clear. No oropharyngeal exudate or posterior oropharyngeal erythema.  Eyes:     Extraocular Movements: Extraocular movements intact.     Conjunctiva/sclera: Conjunctivae normal.     Pupils: Pupils are equal, round, and reactive to light.  Neck:     Vascular: No carotid bruit.     Comments: Negative Spurling's test bilaterally Cardiovascular:     Rate and Rhythm: Regular rhythm. Bradycardia present.     Pulses: Normal pulses.     Heart sounds: Normal heart sounds. No murmur heard. Pulmonary:     Effort: Pulmonary effort is normal. No respiratory distress.     Breath sounds: Normal breath sounds. No wheezing or rales.  Abdominal:     General: Abdomen is flat. Bowel sounds are normal. There is no distension.     Tenderness: There is no abdominal tenderness. There is no guarding.  Musculoskeletal:        General: Normal range of motion.  Cervical back: Normal range of motion and neck supple. No rigidity or tenderness.     Comments: No cervical spinous process tenderness.  No pain with shoulder range of motion.  Negative Hawkins and impingement test.  Negative Tinel's and Phalen's at the left wrist.  Negative Spurling's test of the cervical spine.  He has 5 out of 5 strength with grip strength, bicep, tricep strength as well as internal rotation external rotation and supraspinatus resistance.  Sensation is intact distally.  2+ radial pulse.  Lymphadenopathy:     Cervical: No cervical adenopathy.  Skin:    General: Skin is warm.     Capillary Refill: Capillary refill takes less than 2 seconds.     Findings: No rash.  Neurological:     General: No focal deficit present.     Mental Status: He is alert. Mental status is at baseline. He  is disoriented.     Cranial Nerves: No cranial nerve deficit.     Motor: No weakness.     Coordination: Coordination normal.     Gait: Gait normal.     Deep Tendon Reflexes: Reflexes normal.  Psychiatric:        Behavior: Behavior normal.        Thought Content: Thought content normal.     ED Results / Procedures / Treatments   Labs (all labs ordered are listed, but only abnormal results are displayed) Labs Reviewed  BASIC METABOLIC PANEL WITH GFR - Abnormal; Notable for the following components:      Result Value   Glucose, Bld 128 (*)    All other components within normal limits  CBC  SEDIMENTATION RATE  TROPONIN I (HIGH SENSITIVITY)  TROPONIN I (HIGH SENSITIVITY)    EKG None  Radiology DG Chest 2 View Result Date: 01/30/2024 CLINICAL DATA:  arm pain EXAM: CHEST - 2 VIEW COMPARISON:  None Available. FINDINGS: Cardiac silhouette is unremarkable. No pneumothorax or pleural effusion. The lungs are clear. The visualized skeletal structures are unremarkable. IMPRESSION: No acute cardiopulmonary process. Electronically Signed   By: Sydell Eva M.D.   On: 01/30/2024 17:21    Procedures Procedures    Medications Ordered in ED Medications  dexamethasone (DECADRON) injection 10 mg (has no administration in time range)  sodium chloride 0.9 % bolus 1,000 mL (1,000 mLs Intravenous New Bag/Given 01/30/24 1632)  ketorolac  (TORADOL ) 30 MG/ML injection 30 mg (30 mg Intravenous Given 01/30/24 1631)  diphenhydrAMINE (BENADRYL) injection 25 mg (25 mg Intravenous Given 01/30/24 1626)  prochlorperazine (COMPAZINE) injection 10 mg (10 mg Intravenous Given 01/30/24 1628)    ED Course/ Medical Decision Making/ A&P                                 Medical Decision Making Amount and/or Complexity of Data Reviewed Labs: ordered. Radiology: ordered.  Risk Prescription drug management.  43 year old male with 2 days of headache and some intermittent numbness down the left arm.  History  of migraine headaches in the past.  No trauma or injury.  Afebrile.  No vision changes dizziness, lightheadedness.  His neuroexam is normal.  Has no signs of musculoskeletal causes with negative Spurling's test and negative impingement signs to the shoulder.  He has no weakness or neurological deficits in the left upper extremity.  Patient treated with IV fluids, Toradol , Compazine and Benadryl and headache and numbness in the left hand and nearly resolved.  Patient with normal CBC, BMP,  troponin, ESR.  Chest x-ray ordered and independently reviewed by me shows no acute cardiopulmonary process.  Patient's symptoms concerning for atypical migraine.  Headache and symptoms did nearly completely resolved with headache cocktail.  Discussed headache with patient and spouse, they are given strict return precautions understands signs symptoms return to the ED for. Final Clinical Impression(s) / ED Diagnoses Final diagnoses:  Acute nonintractable headache, unspecified headache type    Rx / DC Orders ED Discharge Orders     None         Orysia Blas 01/30/24 1804    Arline Bennett, MD 01/30/24 Quin Brush

## 2024-02-06 ENCOUNTER — Emergency Department

## 2024-02-06 ENCOUNTER — Other Ambulatory Visit: Payer: Self-pay

## 2024-02-06 ENCOUNTER — Encounter: Payer: Self-pay | Admitting: Emergency Medicine

## 2024-02-06 ENCOUNTER — Emergency Department
Admission: EM | Admit: 2024-02-06 | Discharge: 2024-02-06 | Disposition: A | Discharge status: Home / Self Care | Attending: Emergency Medicine | Admitting: Emergency Medicine

## 2024-02-06 DIAGNOSIS — R609 Edema, unspecified: Secondary | ICD-10-CM

## 2024-02-06 DIAGNOSIS — I82612 Acute embolism and thrombosis of superficial veins of left upper extremity: Secondary | ICD-10-CM | POA: Diagnosis not present

## 2024-02-06 DIAGNOSIS — L539 Erythematous condition, unspecified: Secondary | ICD-10-CM

## 2024-02-06 DIAGNOSIS — M7989 Other specified soft tissue disorders: Secondary | ICD-10-CM | POA: Diagnosis present

## 2024-02-06 NOTE — Discharge Instructions (Signed)
 Your ultrasound shows a superficial thrombus in the area of your swelling.  You may apply warm compresses and use ibuprofen to help with your pain.  Please return for any new, worsening, or change in symptoms or other concerns.  It was a pleasure caring for you today.

## 2024-02-06 NOTE — ED Provider Notes (Signed)
 ----------------------------------------- 4:19 PM on 02/06/2024 -----------------------------------------  Blood pressure (!) 159/102, pulse 97, temperature 98.3 F (36.8 C), temperature source Oral, resp. rate 17, height 6\' 2"  (1.88 m), weight 107 kg, SpO2 98%.  Assuming care from [facility] NP-C.  In short, Cole Wood is a 43 y.o. male with a chief complaint of Arm Swelling .  Refer to the original H&P for additional details.  The current plan of care is to await US  results.  ____________________________________________    ED Results / Procedures / Treatments   Labs (all labs ordered are listed, but only abnormal results are displayed) Labs Reviewed - No data to display   EKG     RADIOLOGY  I personally viewed and evaluated these images as part of my medical decision making, as well as reviewing the written report by the radiologist.  ED Provider Interpretation: I independently reviewed and interpreted imaging and agree with radiologists findings.   US  Venous Img Upper Uni Left (DVT) Result Date: 02/06/2024 CLINICAL DATA:  43 year old male with left upper extremity swelling and pain after IV placement. EXAM: LEFT UPPER EXTREMITY VENOUS DOPPLER ULTRASOUND TECHNIQUE: Gray-scale sonography with graded compression, as well as color Doppler and duplex ultrasound were performed to evaluate the upper extremity deep venous system from the level of the subclavian vein and including the jugular, axillary, basilic, radial, ulnar and upper cephalic vein. Spectral Doppler was utilized to evaluate flow at rest and with distal augmentation maneuvers. COMPARISON:  None Available. FINDINGS: Contralateral Subclavian Vein: Respiratory phasicity is normal and symmetric with the symptomatic side. No evidence of thrombus. Normal compressibility. Internal Jugular Vein: No evidence of thrombus. Normal compressibility, respiratory phasicity and response to augmentation. Subclavian Vein: No  evidence of thrombus. Normal compressibility, respiratory phasicity and response to augmentation. Axillary Vein: No evidence of thrombus. Normal compressibility, respiratory phasicity and response to augmentation. Cephalic Vein: No evidence of thrombus. Normal compressibility, respiratory phasicity and response to augmentation. Basilic Vein: Heterogeneously hypoechoic, minimally expansile, noncompressible and occlusive thrombus is visualized in the region of the antecubital fossa. Brachial Veins: No evidence of thrombus. Normal compressibility, respiratory phasicity and response to augmentation. Radial Veins: No evidence of thrombus. Normal compressibility, respiratory phasicity and response to augmentation. Ulnar Veins: No evidence of thrombus. Normal compressibility, respiratory phasicity and response to augmentation. Other Findings:  None visualized. IMPRESSION: 1. Superficial venous thrombosis of the mid left upper extremity basilic vein. No evidence of surrounding suppuration. 2. No evidence of left upper extremity deep vein thrombosis. Creasie Doctor, MD [facility] Radiology Electronically Signed   By: Creasie Doctor M.D.   On: 02/06/2024 15:47     PROCEDURES:  Critical Care performed: No  Procedures   MEDICATIONS ORDERED IN ED: Medications - No data to display   IMPRESSION / MDM / ASSESSMENT AND PLAN / ED COURSE  I reviewed the triage vital signs and the nursing notes.                              Differential diagnosis includes, but is not limited to, DVT, superficial thrombophlebitis, suppurative thrombophlebitis.  Patient's presentation is most consistent with acute complicated illness / injury requiring diagnostic workup.  The patient is on the cardiac monitor to evaluate for evidence of arrhythmia and/or significant heart rate changes.  Patient's diagnosis is consistent with superficial thrombophlebitis. Patient will be discharged  home with instructions to use warm compresses and NSAIDs per package instructions. Patient  is to follow up with outpatient provider as needed or otherwise directed. Patient is given ED precautions to return to the ED for any worsening or new symptoms.     FINAL CLINICAL IMPRESSION(S) / ED DIAGNOSES   Final diagnoses:  Acute embolism and thrombosis of superficial vein of left upper extremity     Rx / DC Orders   ED Discharge Orders     None        Note:  This document was prepared using Dragon voice recognition software and may include unintentional dictation errors.    Anthony Roland E, PA-C 02/06/24 1625    Jacquie Maudlin, MD 02/06/24 [phone]

## 2024-02-06 NOTE — ED Triage Notes (Signed)
 Patient to ED via POV for left arm swelling and redness from IV. States IV was placed on Tuesday. First noticed on Thursday and worsening since. Sent from [facility] for concern for blood clot.

## 2024-02-06 NOTE — ED Notes (Signed)
 Discharge instructions reviewed with patient. Patient questions answered and opportunity for education reviewed. Patient voices understanding of discharge instructions with no further questions. Patient ambulatory with steady gait to lobby.

## 2024-02-13 NOTE — ED Provider Notes (Signed)
   [facility] Provider Note    Event Date/Time   First MD Initiated Contact with Patient 02/06/24 1619     (approximate)   History   Arm Swelling   HPI  Cole Wood is a 43 y.o. male with history of seizures and as listed in EMR presents to the emergency department for treatment and evaluation of swelling and mild erythema of the left arm. He had an IV placed on Tuesday when being treated for headache. On Thursday, he noticed swelling and tenderness which has progressively worsened. Sent to ER by [facility] for DVT study. No history of clotting disorder or DVT.      Physical Exam   Triage Vital Signs: ED Triage Vitals [02/06/24 1227]  Encounter Vitals Group     BP (!) 159/102     Systolic BP Percentile      Diastolic BP Percentile      Pulse Rate 97     Resp 17     Temp 98.3 F (36.8 C)     Temp Source Oral     SpO2 98 %     Weight 236 lb (107 kg)     Height 6\' 2"  (1.88 m)     Head Circumference      Peak Flow      Pain Score 7     Pain Loc      Pain Education      Exclude from Growth Chart     Most recent vital signs: Vitals:   02/06/24 1227 02/06/24 1628  BP: (!) 159/102 (!) 145/99  Pulse: 97 89  Resp: 17 16  Temp: 98.3 F (36.8 C) 98.5 F (36.9 C)  SpO2: 98% 100%    General: Awake, no distress.  CV:  Good peripheral perfusion.  Resp:  Normal effort.  Abd:  No distention.  Other:  Mild erythema and tenderness of left forearm in area of basilic vein   ED Results / Procedures / Treatments   Labs (all labs ordered are listed, but only abnormal results are displayed) Labs Reviewed - No data to display   EKG  Not indicated.   RADIOLOGY  Image and radiology report reviewed and interpreted by me. Radiology report consistent with the same.  Pending  PROCEDURES:  Critical Care performed: No  Procedures   MEDICATIONS ORDERED IN ED:  Medications - No data to display   IMPRESSION / MDM / ASSESSMENT AND PLAN / ED  COURSE   I have reviewed the triage note.  Differential diagnosis includes, but is not limited to, DVT, phlebitis, cellulitis.  Patient's presentation is most consistent with acute illness / injury with system symptoms.  43 year old male presenting to the emergency department to rule out DVT in the area of the left upper extremity where he had IV access.  See HPI for further details.  Vital signs show hypertension however he is asymptomatic.  Venous ultrasound is pending.  Care transferred to Jenna Poggi, PA-C who will follow-up on results and determine disposition.      FINAL CLINICAL IMPRESSION(S) / ED DIAGNOSES   Final diagnoses:  Acute embolism and thrombosis of superficial vein of left upper extremity     Rx / DC Orders   ED Discharge Orders     None        Note:  This document was prepared using Dragon voice recognition software and may include unintentional dictation errors.   Sherryle Don, FNP 02/13/24 1423
